# Patient Record
Sex: Female | Born: 1995 | Hispanic: Yes | Marital: Single | Smoking: Never smoker
Health system: Southern US, Community
[De-identification: ages and names within clinical notes are randomized; demographics above are authoritative.]

---

## 2018-09-24 ENCOUNTER — Emergency Department
Admission: EM | Admit: 2018-09-24 | Discharge: 2018-09-24 | Disposition: A | Discharge status: Home / Self Care | Payer: BLUE CROSS/BLUE SHIELD | Attending: Emergency Medicine | Admitting: Emergency Medicine

## 2018-09-24 ENCOUNTER — Other Ambulatory Visit: Payer: Self-pay

## 2018-09-24 ENCOUNTER — Emergency Department: Payer: BLUE CROSS/BLUE SHIELD

## 2018-09-24 ENCOUNTER — Encounter: Payer: Self-pay | Admitting: Emergency Medicine

## 2018-09-24 DIAGNOSIS — Y929 Unspecified place or not applicable: Secondary | ICD-10-CM | POA: Diagnosis not present

## 2018-09-24 DIAGNOSIS — Y998 Other external cause status: Secondary | ICD-10-CM | POA: Insufficient documentation

## 2018-09-24 DIAGNOSIS — R51 Headache: Secondary | ICD-10-CM | POA: Diagnosis not present

## 2018-09-24 DIAGNOSIS — H5789 Other specified disorders of eye and adnexa: Secondary | ICD-10-CM | POA: Insufficient documentation

## 2018-09-24 DIAGNOSIS — Y9389 Activity, other specified: Secondary | ICD-10-CM | POA: Diagnosis not present

## 2018-09-24 MED ORDER — TETRACAINE HCL 0.5 % OP SOLN
1.0000 [drp] | Freq: Once | OPHTHALMIC | Status: AC
  Administered 2018-09-24: 1 [drp] via OPHTHALMIC

## 2018-09-24 MED ORDER — CYCLOBENZAPRINE HCL 10 MG PO TABS
10.0000 mg | ORAL_TABLET | Freq: Once | ORAL | Status: AC
  Administered 2018-09-24: 10 mg via ORAL
  Filled 2018-09-24: qty 1

## 2018-09-24 MED ORDER — POLYMYXIN B-TRIMETHOPRIM 10000-0.1 UNIT/ML-% OP SOLN: 1.0000 [drp] | Freq: Three times a day (TID) | OPHTHALMIC | 0 refills | Status: AC

## 2018-09-24 MED ORDER — TETRACAINE HCL 0.5 % OP SOLN
OPHTHALMIC | Status: AC
  Administered 2018-09-24: 1 [drp] via OPHTHALMIC
  Filled 2018-09-24: qty 4

## 2018-09-24 MED ORDER — FLUORESCEIN SODIUM 1 MG OP STRP
1.0000 | ORAL_STRIP | Freq: Once | OPHTHALMIC | Status: AC
  Administered 2018-09-24: 1 via OPHTHALMIC
  Filled 2018-09-24: qty 1

## 2018-09-24 MED ORDER — LIDOCAINE-EPINEPHRINE-TETRACAINE (LET) SOLUTION: 3.0000 mL | Freq: Once | NASAL | Status: DC
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
## 2018-09-28 ENCOUNTER — Emergency Department: Payer: No Typology Code available for payment source

## 2018-09-28 ENCOUNTER — Emergency Department
Admission: EM | Admit: 2018-09-28 | Discharge: 2018-09-28 | Disposition: A | Discharge status: Home / Self Care | Payer: No Typology Code available for payment source | Attending: Emergency Medicine | Admitting: Emergency Medicine

## 2018-09-28 ENCOUNTER — Other Ambulatory Visit: Payer: Self-pay

## 2018-09-28 DIAGNOSIS — S39012A Strain of muscle, fascia and tendon of lower back, initial encounter: Secondary | ICD-10-CM | POA: Diagnosis not present

## 2018-09-28 DIAGNOSIS — Y92410 Unspecified street and highway as the place of occurrence of the external cause: Secondary | ICD-10-CM | POA: Insufficient documentation

## 2018-09-28 DIAGNOSIS — Y998 Other external cause status: Secondary | ICD-10-CM | POA: Diagnosis not present

## 2018-09-28 DIAGNOSIS — Y9389 Activity, other specified: Secondary | ICD-10-CM | POA: Insufficient documentation

## 2018-09-28 DIAGNOSIS — S0990XA Unspecified injury of head, initial encounter: Secondary | ICD-10-CM | POA: Diagnosis present

## 2018-09-28 DIAGNOSIS — S060X0A Concussion without loss of consciousness, initial encounter: Secondary | ICD-10-CM

## 2018-09-28 DIAGNOSIS — G44309 Post-traumatic headache, unspecified, not intractable: Secondary | ICD-10-CM | POA: Diagnosis not present

## 2018-09-28 DIAGNOSIS — S161XXA Strain of muscle, fascia and tendon at neck level, initial encounter: Secondary | ICD-10-CM | POA: Diagnosis not present

## 2018-09-28 MED ORDER — BACLOFEN 10 MG PO TABS: 10.0000 mg | ORAL_TABLET | Freq: Three times a day (TID) | ORAL | 1 refills | Status: AC

## 2018-09-28 MED ORDER — MELOXICAM 15 MG PO TABS: 15.0000 mg | ORAL_TABLET | Freq: Every day | ORAL | 2 refills | Status: AC
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
## 2018-09-28 NOTE — ED Notes (Signed)
Urine POC negative. 
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
## 2018-09-28 NOTE — ED Notes (Signed)
Per Dr Roxan Hockeyobinson no additional testing needed at this time except to add urine preg - pt given container to collect sample
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
